# Patient Record
Sex: Male | Born: 1960 | Race: White | Hispanic: No | Smoking: Current every day smoker
Health system: Southern US, Community
[De-identification: ages and names within clinical notes are randomized; demographics above are authoritative.]

---

## 2002-02-09 ENCOUNTER — Ambulatory Visit (HOSPITAL_COMMUNITY): Admission: RE | Admit: 2002-02-09 | Discharge: 2002-02-09 | Payer: Self-pay | Admitting: Orthopedic Surgery

## 2002-02-09 ENCOUNTER — Encounter: Payer: Self-pay | Admitting: Orthopedic Surgery

## 2004-01-19 ENCOUNTER — Emergency Department (HOSPITAL_COMMUNITY): Admission: EM | Admit: 2004-01-19 | Discharge: 2004-01-19 | Payer: Self-pay | Admitting: Emergency Medicine

## 2004-09-06 ENCOUNTER — Emergency Department (HOSPITAL_COMMUNITY): Admission: EM | Admit: 2004-09-06 | Discharge: 2004-09-06 | Payer: Self-pay | Admitting: Emergency Medicine

## 2004-12-10 ENCOUNTER — Emergency Department (HOSPITAL_COMMUNITY): Admission: EM | Admit: 2004-12-10 | Discharge: 2004-12-11 | Payer: Self-pay | Admitting: Emergency Medicine

## 2004-12-11 ENCOUNTER — Inpatient Hospital Stay (HOSPITAL_COMMUNITY): Admission: EM | Admit: 2004-12-11 | Discharge: 2004-12-13 | Payer: Self-pay | Admitting: Psychiatry

## 2004-12-11 ENCOUNTER — Ambulatory Visit: Payer: Self-pay | Admitting: Psychiatry

## 2005-06-02 ENCOUNTER — Emergency Department (HOSPITAL_COMMUNITY): Admission: EM | Admit: 2005-06-02 | Discharge: 2005-06-02 | Payer: Self-pay | Admitting: Emergency Medicine

## 2007-09-22 ENCOUNTER — Inpatient Hospital Stay (HOSPITAL_COMMUNITY): Admission: RE | Admit: 2007-09-22 | Discharge: 2007-09-23 | Payer: Self-pay | Admitting: Orthopedic Surgery

## 2007-09-22 ENCOUNTER — Ambulatory Visit: Payer: Self-pay | Admitting: *Deleted

## 2007-09-23 ENCOUNTER — Encounter (INDEPENDENT_AMBULATORY_CARE_PROVIDER_SITE_OTHER): Payer: Self-pay | Admitting: Orthopedic Surgery

## 2007-12-13 ENCOUNTER — Emergency Department (HOSPITAL_COMMUNITY): Admission: EM | Admit: 2007-12-13 | Discharge: 2007-12-13 | Payer: Self-pay | Admitting: Emergency Medicine

## 2009-06-01 IMAGING — CR DG LUMBAR SPINE 2-3V
2 series · 2 of 2 positions shown · non-contrast
Comparison: None

CLINICAL DATA: Degenerative disc disease

LUMBAR SPINE - 2-3 VIEW

[view not recorded (1 of 2)]
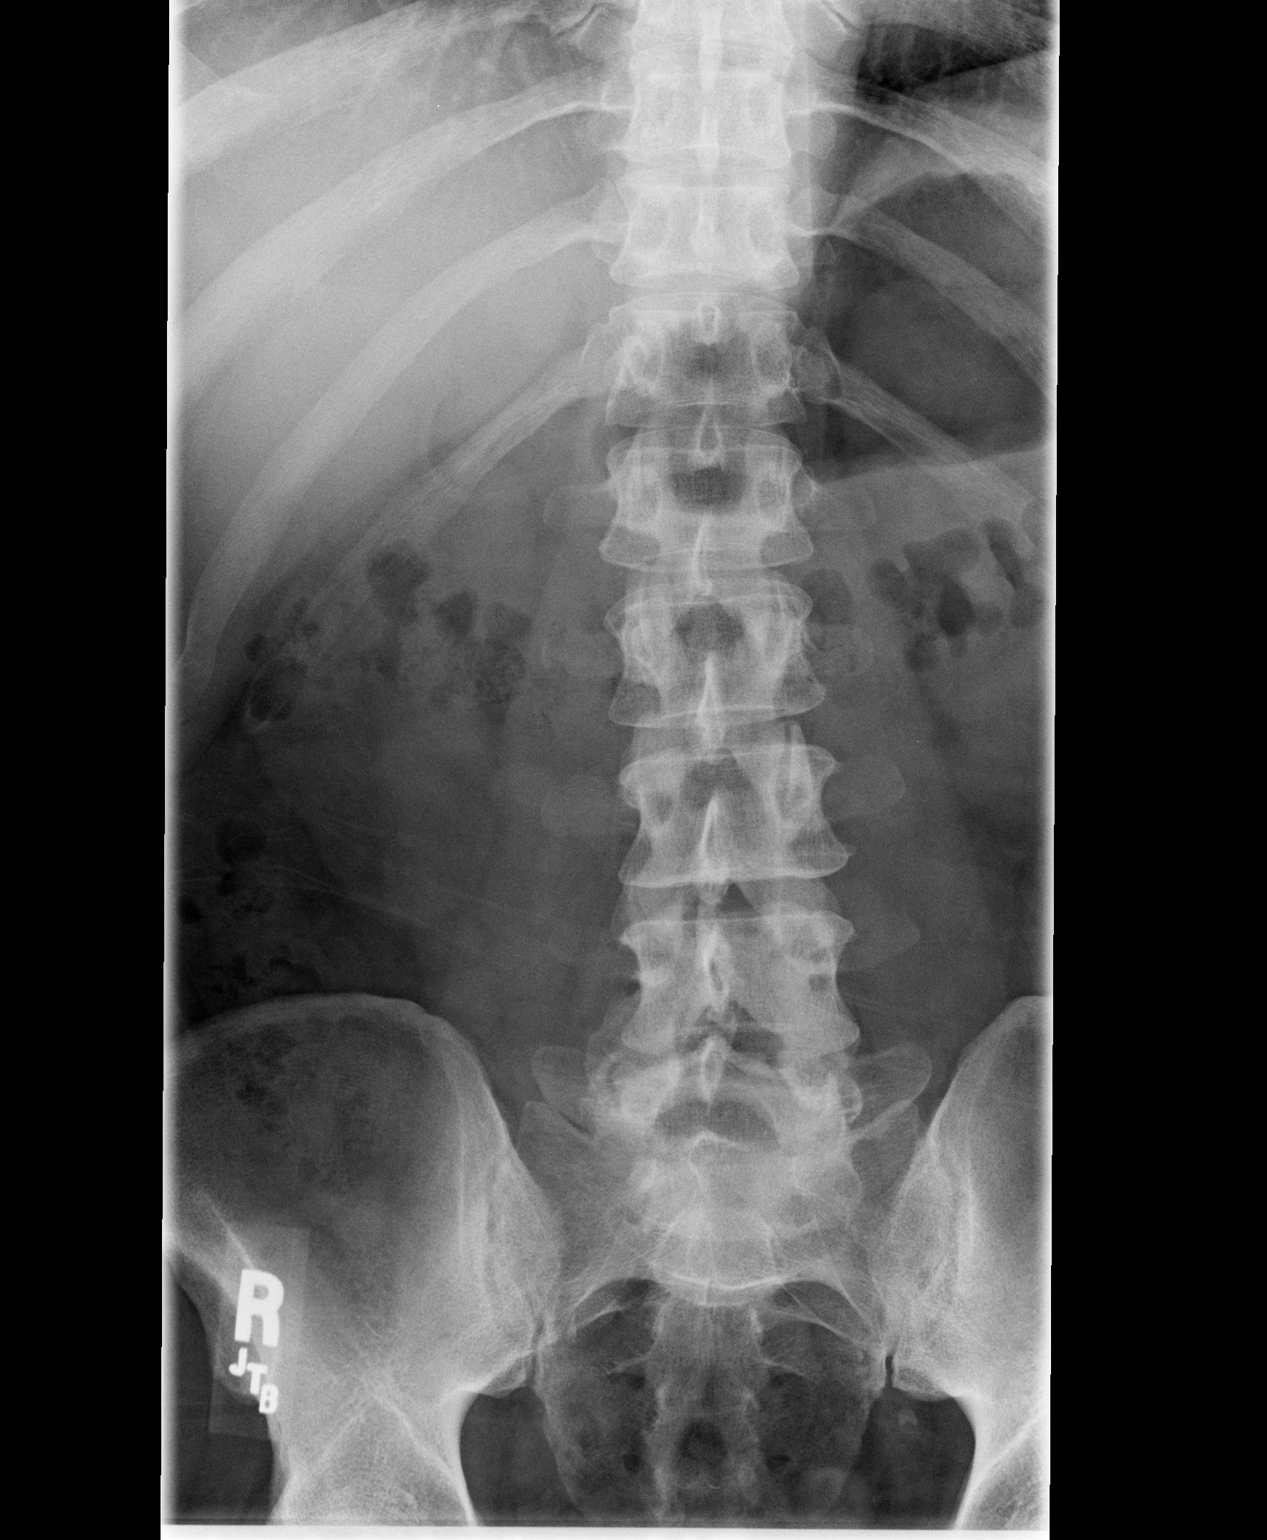

[view not recorded (2 of 2)]
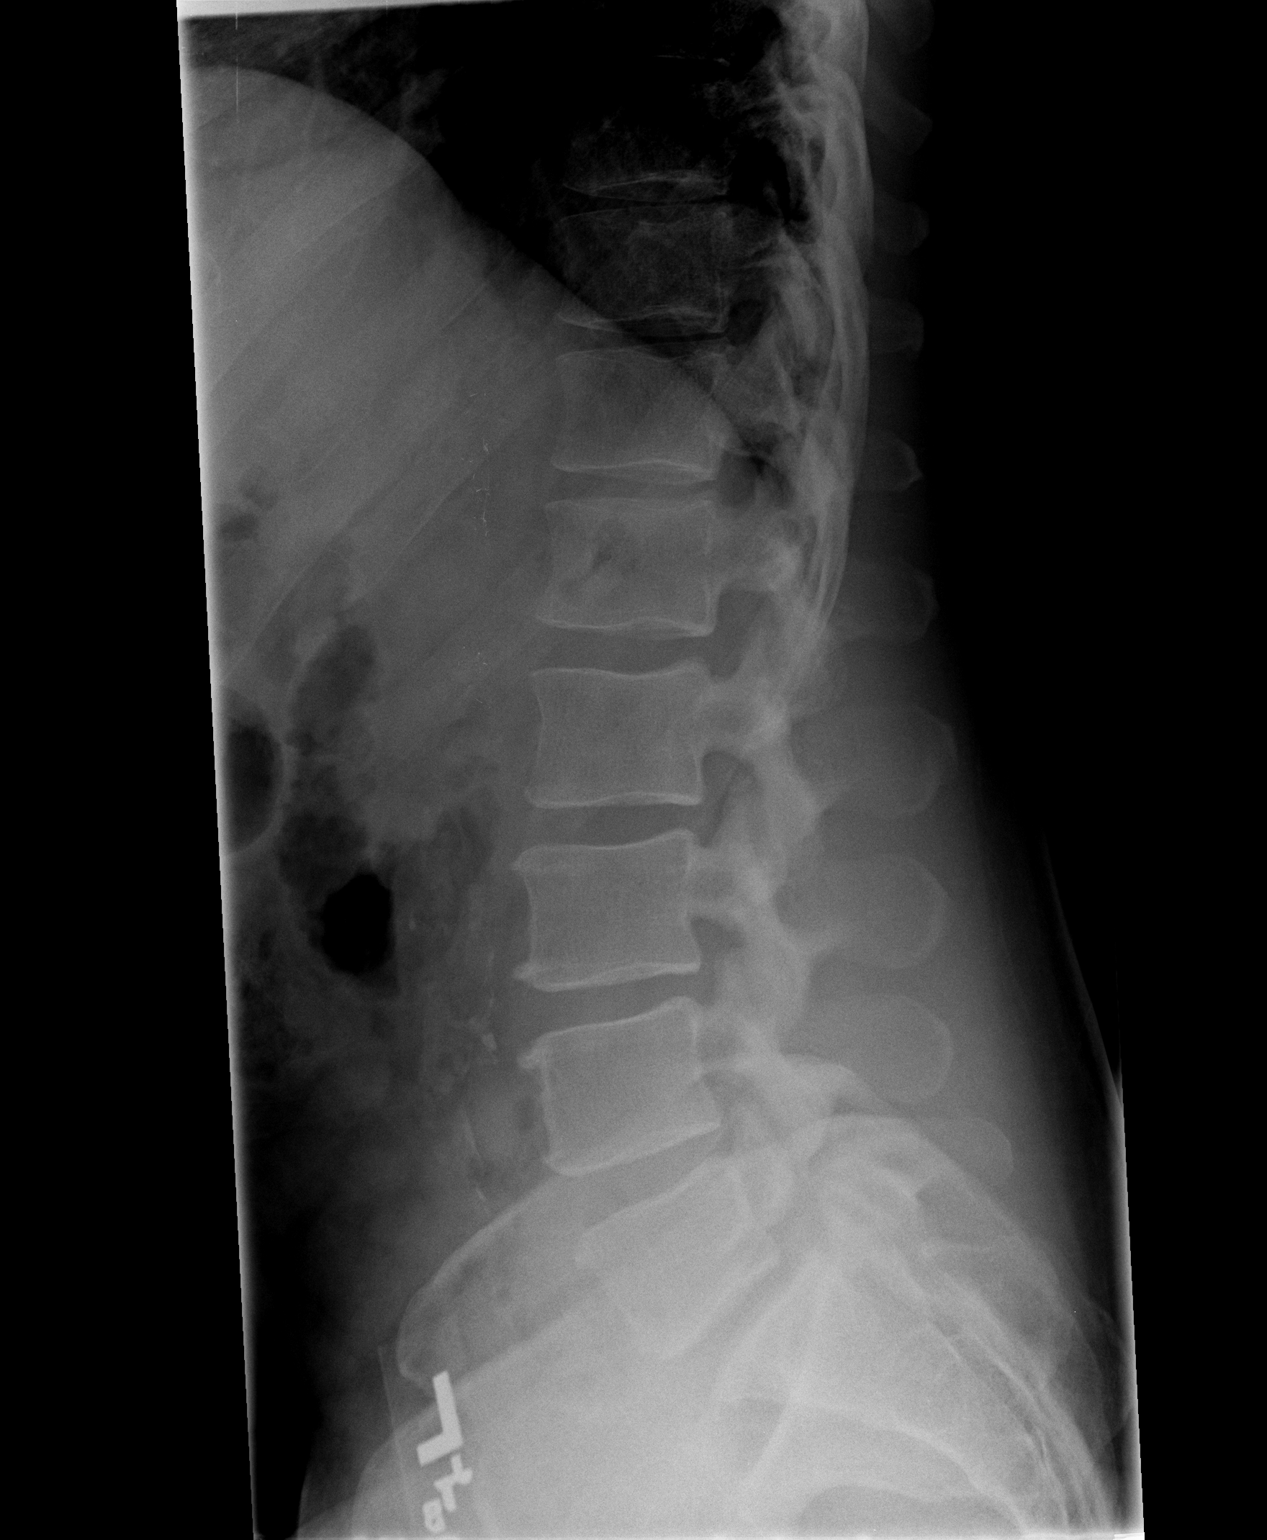

[2 of 2 positions shown; findings below may reference images not displayed]

FINDINGS: Grade 1 spondylolisthesis at L5, S1 is present.  Mild
narrowing at L2-3, L3-4, and L4-5 is present.  No vertebral body
height loss.
IMPRESSION: L5-S1 spondylolisthesis.

## 2010-07-16 NOTE — Op Note (Signed)
NAMEDADRIAN, BALLANTINE NO.:  [id number]   MEDICAL RECORD NO.:  [id number]          PATIENT TYPE:  INP   LOCATION:  5036                         FACILITY:  [facility]   PHYSICIAN:  Balinda Quails, M.D.    DATE OF BIRTH:  September 10, 1960   DATE OF PROCEDURE:  09/22/2007  DATE OF DISCHARGE:  09/23/2007                               OPERATIVE REPORT   SURGEON:  Balinda Quails, MD   CO-SURGEON:  Alvy Beal, MD   ANESTHETIC:  General endotracheal.   PREOPERATIVE DIAGNOSIS:  L5-S1 degenerative disk disease.   POSTOPERATIVE DIAGNOSIS:  L5-S1 degenerative disk disease.   PROCEDURE:  L5-S1 anterior lumbar interbody fusion (ALIF).   CLINICAL NOTE:  Mr. Hallenbeck is a 50 year old gentleman scheduled at this  time for L5-S1 ALIF.  He was seen preoperatively and details of the  operative procedure were reviewed.  Potential complications were  discussed including but not limited to pulmonary embolus, DVT, vessel  injury, limb ischemia, bleeding, transfusion, ureter injury, sexual  dysfunction, or other complications.   OPERATIVE PROCEDURE:  The patient was brought to the operating room in  stable condition.  He was placed in supine position.  General  endotracheal anesthesia was induced.  Central venous catheter, arterial  line, Foley catheter, and a pulse oximetry were placed on the left foot.   In the supine position, the abdomen was prepped and draped in the  sterile fashion.  A transverse skin incision made in the left lower  quadrant over the projection of L5-S1.  Dissection was carried down  through the subcutaneous tissue with electrocautery.  The left anterior  rectus sheath was exposed.  This was incised transversely from midline  to lateral margin of the rectus muscle.  The rectus muscle was mobilized  bluntly.  Rectus muscle was retracted medially and the left  retroperitoneal space was entered without difficulty.  The left psoas  muscle and genitofemoral nerve were  identified.  The nerve was preserved  on the muscle.   The L5-S1 disk could be palpated.  The soft tissues over the disks were  pushed with the abdominal contents and left ureter.  The nerves were  also pushed with the abdominal contents medially.  The L5-S1 disk was  palpated easily.  Middle sacral vessels were controlled with bipolar  cautery and divided.  The left common iliac vein was mobilized and  retracted to the left and the right common iliac vein was mobilized and  retracted to the right.  This allowed full exposure of the L5-S1 disk.   Retraction was then carried out with a Best boy using Brau  reverse-lip blades.  The blades were placed on the lateral margins of  the L5-S1 bodies bilaterally to fully expose the disk.   Dr. Shon Baton then completed the L5-S1 ALIF and completed closure.   There were no apparent complications during the exposure procedure.      Balinda Quails, M.D.  Electronically Signed     PGH/MEDQ  D:  09/24/2007  T:  09/25/2007  Job:  [id number]   cc:  Alvy Beal, MD

## 2010-07-16 NOTE — Op Note (Signed)
Ethan Kane, Ethan Kane NO.:  [id number]   MEDICAL RECORD NO.:  [id number]          PATIENT TYPE:  INP   LOCATION:  5036                         FACILITY:  [facility]   PHYSICIAN:  Alvy Beal, MD    DATE OF BIRTH:  1961/02/03   DATE OF PROCEDURE:  DATE OF DISCHARGE:                               OPERATIVE REPORT   PREOPERATIVE DIAGNOSIS:  Isthmic spondylolisthesis with degenerative  disk disease, L5-S1.   POSTOPERATIVE DIAGNOSIS:  Isthmic spondylolisthesis with degenerative  disk disease, L5-S1.   OPERATIVE PROCEDURE:  Anterior lumbar interbody fusion.   SURGEON:  Alvy Beal, MD   FIRST ASSISTANT:  Crissie Reese, PA   COMPLICATIONS:  None.   CONDITION:  Stable.   INSTRUMENTATION USE:  Globus anterior cervical PEEK interbody spacer  with anterior lumbar plating and instrumentation with 25-mm screws.   COMPLICATIONS:  None.   CONDITION:  Stable.   HISTORY:  This is a pleasant 50 year old gentleman who has been having  chronic severe low back, buttock, and thigh pain for sometime now.  Attempts at conservative management consisting of physiotherapy,  injection therapy, and narcotic medications have failed to alleviate his  symptoms.  As a result, we elected to proceed with surgery.  All  appropriate risks, benefits, and alternatives were discussed with the  patient and consent was obtained.   OPERATIVE NOTE:  The patient was brought to the operating room and  placed supine on the operating table.  After successful induction of  general anesthesia and endotracheal intubation, TED/SCD and Foley were  applied.  The patient's abdomen was prepped and draped in a standard  fashion.   Dr. Madilyn Fireman then performed a standard anterior lumbar approach.  Please  refer to his dictation for specifics on the approach.  After assisting  him with the approach and confirming the L5-S1 level with fluoroscopy,  Dr. Madilyn Fireman scrubbed out and then I proceeded with the  fusion portion of  the case.   A 10 blade scalpel was used to incise the L5-S1 disk space and the disk  was sharply incised.  Using a combination of pituitary rongeurs,  curettes, and Kerrison rongeurs, I resected the entire disk material.  I  then removed the posterior lip of bone spur from the S1 and L5 vertebral  bodies with a 3-mm Kerrison.  At this point, I had excellent  decompression.  There was a gentle reduction of the spondylolisthesis  but not back to neutral.  At this point with the diskectomy complete, I  then rasped the edges, so I had bleeding at endochondral or subchondral  bone.   At this point with the endplates prepared and decompression complete, I  then measured the interbody space.  I went with a 15-mm 8-degree  lordotic device.  The 17 was just too big and it kind of overstuffed the  compartment and what I felt was going to be over distraction of the  posterior elements.  At this point with the 15 as a better fit, I then  mounted it to the appropriate depth and confirmed position  in the  lateral planes.  I then locked all through the plates and then placed 25-  mm screws through the anterior lumbar plate, the interbody device, and  into the bone.  I then placed two screws into the S1 vertebral body.  At  this point, I had excellent fixation of the device and it was stable.  I  initially packed the interbody device with 10 mL of Actifuse and I took  another 10 mL to put anterior to the device as a sentinel fusion.  At  this point, I irrigated the wound copiously with normal saline and  removed the Thompson retractor blades.  There was no significant  bleeding.   At this point with the fixation complete, I then checked to ensure there  was no significant bleeding, closed the fascia of the rectus with a  running 0 Vicryl suture, superficial with 2-0, and 3-0 Monocryl for the  skin.  Steri-Strips and dry dressing were applied.  Final counts as well  as the  intraoperative abdominal plain films were satisfactory with no  significant problems.  The patient was extubated and transferred to the  PACU without incident.      Alvy Beal, MD  Electronically Signed     DDB/MEDQ  D:  09/22/2007  T:  09/23/2007  Job:  [id number]   cc:   Dr. Madilyn Fireman

## 2010-07-19 NOTE — H&P (Signed)
NAME:  Ethan Kane, Ethan Kane NO.:  [id number]   MEDICAL RECORD NO.:  [id number]          PATIENT TYPE:  IPS   LOCATION:  0307                          [facility]ACILITY[facility]   PHYSICIAN:  Geoffery Lyons, M.D.      DATE OF BIRTH:  1960-04-29   DATE OF ADMISSION:  12/11/2004  DATE OF DISCHARGE:  12/13/2004                         PSYCHIATRIC ADMISSION ASSESSMENT   IDENTIFYING INFORMATION:  This is a 50 year old married white male,  voluntarily admitted on December 11, 2004.   HISTORY OF PRESENT ILLNESS:  The patient is here for detox off opiates.  The  patient reports a history of opiate abuse, has been abusing Percocet 10/325  and Vicodin, taking up to 8 to 10 per day.  His last use was 3 days ago.  Her has been abusing for the past 2 years.  He states his wife wants him to  stop.  He does deny any other drug use.  The patient is worried about what  he will do when he is off the opiates to control his pain.  He has lost  about 25 pounds.  He reports a problem with sleep.  He does take occasional  Xanax that is prescribed to help him with anxiety.  The patient denies any  abuse of that particular medication, denies any psychotic symptoms or  suicidal ideation.   PAST PSYCHIATRIC HISTORY:  First admission to [facility].  States he has detoxed himself before.  He reports no history of any suicide  attempts.   SOCIAL HISTORY:  He is a 50 year old married white male with 3 children.  He  lives with his wife and children.  He works as a Aeronautical engineer.   FAMILY HISTORY:  Denies.   ALCOHOL DRUG HISTORY:  The patient smokes, denies any alcohol use, denies  any IV drug use, denies any other drug use.   PAST MEDICAL HISTORY:  Primary care Dhruva Orndoff is Dr. Pete Glatter.  His pain  management is Dr. Vear Clock in [place].  Medical problems are chronic back  pain.   MEDICATIONS:  The patient has been on Lexapro 10 mg for the past 2 years,  takes Xanax 0.25 mg p.r.n. for  anxiety, prescribed by Dr. Vear Clock.   DRUG ALLERGIES:  No known allergies.   PHYSICAL EXAMINATION:  This is a healthy-appearing middle-aged male, in no  acute distress.  He appears well-nourished.  He was fully assessed at [facility].  Temperature is 97.5, 67 heart rate, 20  respirations, blood pressure 131/82, 217 pounds.  He is 75 inches tall, 99%  saturation.  Urine drug screen is positive for opiates, positive for  benzodiazepines.  Acetaminophen level less than 10, alcohol level less than  5.  Liver functions are within normal limits.   MENTAL STATUS EXAM:  Alert, cooperative male, good eye contact, casually  dressed.  Speech is clear, normal pace and tone.  The patient feels tired.  The patient initially seems sleepy with a little bit or irritability, then  was cooperative and receptive to interview.  Thought processes are coherent,  no evidence of psychosis.  Cognitive function intact, memory is good,  judgment is fair, insight is fair.  Poor impulse control.  Average  intelligence.   ADMISSION DIAGNOSES:  AXIS I:  Opiate dependence, depressive disorder not  otherwise specified.  AXIS II:  Deferred.  AXIS III:  Chronic back pain.  AXIS IV:  Problems with primary support group, other psychosocial problems,  medical problems.  AXIS V:  Current is 40, past year 60-65.   PLAN:  Plan is to detox the patient.  Relapse prevention.  We will contact  pain management for options after the patient is detoxed from opiates.  We  will initially decrease the Lexapro as the patient feels he does not need  the medicine and is anxious to get off the Lexapro.  Options for other  medications were discussed in regards to Neurontin and analgesics and other  antidepressants that could be of benefit.  The patient at this time again  just wants to taper his Lexapro.   TENTATIVE LENGTH OF CARE:  3-5 days.      Landry Corporal, N.P.      Geoffery Lyons, M.D.   Electronically Signed    JO/MEDQ  D:  12/13/2004  T:  12/13/2004  Job:  [id number]

## 2010-07-19 NOTE — Discharge Summary (Signed)
NAMEMarland Kitchen  Ethan Kane, Ethan Kane NO.:  [id number]   MEDICAL RECORD NO.:  [id number]          PATIENT TYPE:  IPS   LOCATION:  0307                          [facility]ACILITY[facility]  [facility]   PHYSICIAN:  Geoffery Lyons, M.D.      DATE OF BIRTH:  01-27-1961   DATE OF ADMISSION:  12/11/2004  DATE OF DISCHARGE:  12/13/2004                                 DISCHARGE SUMMARY   CHIEF COMPLAINT AND PRESENT ILLNESS:  This was the first admission to [facility] for this 50 year old married white male voluntarily  admitted.  Admitted for opiate detox.  History of opiate abuse.  Has been  using Percocet 10/325 mg and Vicodin, taking up to 8-10 per day.  His last  use was three days ago.  Has been abusing for the past two years.  His wife  wanted him to stop.  Does deny any other drug use.  Worried about what he  will do when he is off the opiates to control his pain.  He has lost about  25 pounds.  Reports problem with sleep.  Takes occasional Xanax, prescribed  for anxiety.  Claims no abuse.   PAST PSYCHIATRIC HISTORY:  First time at [facility].  He has detoxed  himself before.   ALCOHOL/DRUG HISTORY:  History of opiate abuse, using Percocet and Vicodin  for the last two years.   MEDICAL HISTORY:  Chronic back pain.   MEDICATIONS:  Lexapro 10 mg, Xanax 0.25 mg as needed.   PHYSICAL EXAMINATION:  Performed and failed to show any acute findings.   LABORATORY DATA:  TSH 1.419.  Blood chemistry with glucose 96.  Drug screen  positive for opiates, benzodiazepines.  Liver enzymes with SGOT 13, SGPT 18.   MENTAL STATUS EXAM:  Alert, cooperative male with good eye contact.  Casually dressed.  Speech was clear, normal  in pace and tone.  Feeling  tired.  Initially seems __________ with a little bit of irritability, then  was more cooperative and receptive.  Thought processes are logical, coherent  and relevant.  No evidence of delusions.  No active suicidal or homicidal  ideation.   No hallucinations.  Cognition was well-preserved.   ADMISSION DIAGNOSES:  AXIS I:  Opiate dependence.  Depressive disorder not  otherwise specified.  AXIS II:  No diagnosis.  AXIS III:  Chronic back pain.  AXIS IV:  Moderate.  AXIS V:  GAF upon admission 35; highest GAF in the last year 60-65.   HOSPITAL COURSE:  He was admitted.  He was started in individual and group  psychotherapy.  He was given trazodone for sleep and he was given Lexapro.  He was detoxified with clonidine.  He endorsed persistent back pain, neck,  rotator cuff.  Was placed on opiates.  They were not working and he  increased the amount he was using.  Said that he was running out of his  month's supply in two weeks.  The other weeks, he would get it in the  street.  Endorsed he has been on multiple medications, Percocet, OxyContin,  morphine, Duragesic patches, morphine patches.  Different side effects or  increased tolerance.  Concerned because he did not know what was going to be  available to manage his pain.  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  He is separated from  his wife due to his abuse of opiates.  By October 12th, he endorsed that he  was tolerating the pain but it was more bearable.  Did start sleeping  better.  He was willing to start addressing his issues.  There was a family  session with his wife that went reasonably well.  She expressed the lack of  trust but was supportive.  On October 13th, he endorsed he was better.  Dealing with the pain in neck and back but dealing with it better.  Insightful.  Endorsed that he needed to find ways of dealing with the pain.  Still not buying the idea that he is addicted.  Willing to see a Veterinary surgeon.   DISCHARGE DIAGNOSES:  AXIS I:  Opiate dependence.  Depressive disorder not  otherwise specified.  AXIS II:  No diagnosis.  AXIS III:  Chronic back pain.  AXIS IV:  Moderate.  AXIS V:  GAF upon  discharge 60-65.   DISCHARGE MEDICATIONS:  1.  Lexapro 10 mg per day.  2.  Voltaren 25 mg twice a day.  3.  Arthrotec 75 mg twice a day.   FOLLOW UP:  Glendell Docker for counseling.      Geoffery Lyons, M.D.  Electronically Signed     IL/MEDQ  D:  01/08/2005  T:  01/10/2005  Job:  [id number]

## 2010-11-29 LAB — CBC
HCT: 43
MCV: 97.8
Platelets: 195
RDW: 13.7
WBC: 8.8

## 2010-11-29 LAB — ABO/RH: ABO/RH(D): A POS

## 2010-11-29 LAB — TYPE AND SCREEN: ABO/RH(D): A POS

## 2011-08-20 DIAGNOSIS — M51379 Other intervertebral disc degeneration, lumbosacral region without mention of lumbar back pain or lower extremity pain: Secondary | ICD-10-CM | POA: Insufficient documentation

## 2011-08-20 DIAGNOSIS — F17200 Nicotine dependence, unspecified, uncomplicated: Secondary | ICD-10-CM | POA: Insufficient documentation

## 2011-08-20 DIAGNOSIS — M5137 Other intervertebral disc degeneration, lumbosacral region: Secondary | ICD-10-CM | POA: Insufficient documentation

## 2011-08-20 DIAGNOSIS — I10 Essential (primary) hypertension: Secondary | ICD-10-CM | POA: Insufficient documentation

## 2011-08-20 DIAGNOSIS — F329 Major depressive disorder, single episode, unspecified: Secondary | ICD-10-CM | POA: Insufficient documentation

## 2013-09-07 DIAGNOSIS — M75101 Unspecified rotator cuff tear or rupture of right shoulder, not specified as traumatic: Secondary | ICD-10-CM | POA: Insufficient documentation

## 2013-12-21 DIAGNOSIS — M5412 Radiculopathy, cervical region: Secondary | ICD-10-CM | POA: Insufficient documentation

## 2016-01-30 ENCOUNTER — Ambulatory Visit (INDEPENDENT_AMBULATORY_CARE_PROVIDER_SITE_OTHER): Payer: Managed Care, Other (non HMO) | Admitting: Physician Assistant

## 2016-01-30 VITALS — BP 122/72 | HR 64 | Temp 99.2°F | Resp 17 | Ht 73.5 in | Wt 202.0 lb

## 2016-01-30 DIAGNOSIS — K029 Dental caries, unspecified: Secondary | ICD-10-CM | POA: Diagnosis not present

## 2016-01-30 DIAGNOSIS — K047 Periapical abscess without sinus: Secondary | ICD-10-CM | POA: Diagnosis not present

## 2016-01-30 DIAGNOSIS — T7840XA Allergy, unspecified, initial encounter: Secondary | ICD-10-CM | POA: Insufficient documentation

## 2016-01-30 MED ORDER — CHLORHEXIDINE GLUCONATE 0.12% ORAL RINSE (MEDLINE KIT): 15.0000 mL | Freq: Two times a day (BID) | OROMUCOSAL | 0 refills | Status: AC

## 2016-01-30 MED ORDER — AMOXICILLIN-POT CLAVULANATE 875-125 MG PO TABS: 1.0000 | ORAL_TABLET | Freq: Two times a day (BID) | ORAL | 0 refills | Status: AC

## 2016-01-30 NOTE — Progress Notes (Signed)
Patient ID: Ethan Kane, male     DOB: 03/18/1960, 55 y.o.    MRN: [id number]  PCP: No primary care provider on file.  Chief Complaint  Patient presents with  . Dental Injury    Subjective:   This patient is new to this practice and presents for evaluation of presumed dental infection.  First noticed pain yesterday afternoon that became progressively worse, such that he wasn't able to sleep last night. He has known carious teeth, but has not seen a dentist in years. "I'd rather have heart surgery."  No fever/chills. No nausea/vomiting. No sinus pressure/pain.  Review of Systems  Constitutional: Negative for chills and fever.  HENT: Positive for dental problem and facial swelling. Negative for congestion, drooling and sore throat.   Respiratory: Negative for cough and shortness of breath.   Cardiovascular: Negative for chest pain.  Gastrointestinal: Negative for diarrhea, nausea and vomiting.  Neurological: Negative for headaches.     Prior to Admission medications   Not on File     Not on File   Patient Active Problem List   Diagnosis Date Noted  . Allergy 01/30/2016  . Cervical radiculopathy 12/21/2013  . Rotator cuff syndrome of right shoulder 09/07/2013  . DDD (degenerative disc disease), lumbosacral 08/20/2011  . Depression 08/20/2011  . Essential hypertension 08/20/2011  . Tobacco use disorder 08/20/2011     Family History  Problem Relation Age of Onset  . Hypertension Sister   . Hyperlipidemia Brother   . Hypertension Brother      Social History   Social History  . Marital status: Divorced    Spouse name: n/a  . Number of children: 3  . Years of education: N/A   Occupational History  . director of [employer]  . Smoking status: Current Every Day Smoker    Packs/day: 0.75    Years: 20.00  . Smokeless tobacco: Never Used  . Alcohol use No  . Drug use: No  . Sexual activity: No   Other  Topics Concern  . Not on file   Social History Narrative  . No narrative on file         Objective:  Physical Exam  Constitutional: He is oriented to person, place, and time. He appears well-developed and well-nourished. He is active and cooperative. No distress.  BP 122/72 (BP Location: Right Arm, Patient Position: Sitting, Cuff Size: Normal)   Pulse 64   Temp 99.2 F (37.3 C) (Oral)   Resp 17   Ht 6' 1.5" (1.867 m)   Wt 202 lb (91.6 kg)   SpO2 100%   BMI 26.29 kg/m    HENT:  Head: Normocephalic and atraumatic.    Right Ear: Hearing and external ear normal.  Left Ear: Hearing and external ear normal.  Mouth/Throat: Uvula is midline, oropharynx is clear and moist and mucous membranes are normal. Abnormal dentition. Dental caries present.  Multiple broken/rotten teeth. RIGHT lower teeth are tender on palpation.  Eyes: Conjunctivae are normal.  Pulmonary/Chest: Effort normal.  Neurological: He is alert and oriented to person, place, and time.  Psychiatric: He has a normal mood and affect. His speech is normal and behavior is normal.             Assessment & Plan:  1. Carious teeth 2. Dental infection Antibiotic. Hydrate. NSAIDS. Schedule promptly with a dentist, which he agree to do. Smoking cessation encouraged. He is not ready. -  amoxicillin-clavulanate (AUGMENTIN) 875-125 MG tablet; Take 1 tablet by mouth 2 (two) times daily.  Dispense: 20 tablet; Refill: 0 - chlorhexidine gluconate, MEDLINE KIT, (PERIDEX) 0.12 % solution; Use as directed 15 mLs in the mouth or throat 2 (two) times daily.  Dispense: 120 mL; Refill: 0   Fara Chute, PA-C Physician Assistant-Certified [facility]

## 2016-01-30 NOTE — Patient Instructions (Addendum)
Ibuprofen 600 mg up to 3 times daily with food. Contact a dentist to schedule as soon as possible.    IF you received an x-ray today, you will receive an invoice [facility]. Please contact [facility] at [phone] with questions or concerns regarding your invoice.   IF you received labwork today, you will receive an invoice from [facility]. Please contact [facility] at [phone] with questions or concerns regarding your invoice.   Our billing staff will not be able to assist you with questions regarding bills from these companies.  You will be contacted with the lab results as soon as they are available. The fastest way to get your results is to activate your My Chart account. Instructions are located on the last page of this paperwork. If you have not heard from us regarding the results in 2 weeks, please contact this office.

## 2018-11-02 DEATH — deceased
